# Patient Record
Sex: Male | Born: 1990 | Race: White | Hispanic: No | Marital: Single
Health system: Southern US, Community
[De-identification: ages and names within clinical notes are randomized; demographics above are authoritative.]

---

## 2009-12-15 ENCOUNTER — Emergency Department: Payer: Self-pay | Admitting: Emergency Medicine

## 2009-12-15 ENCOUNTER — Emergency Department: Payer: Self-pay | Admitting: Unknown Physician Specialty

## 2011-04-14 ENCOUNTER — Emergency Department: Payer: Self-pay | Admitting: Emergency Medicine

## 2011-11-21 ENCOUNTER — Emergency Department: Payer: Self-pay | Admitting: Emergency Medicine

## 2011-11-25 ENCOUNTER — Emergency Department: Payer: Self-pay | Admitting: Emergency Medicine

## 2011-11-25 LAB — COMPREHENSIVE METABOLIC PANEL
Albumin: 3.6 g/dL (ref 3.4–5.0)
Alkaline Phosphatase: 124 U/L (ref 50–136)
BUN: 12 mg/dL (ref 7–18)
Calcium, Total: 8.9 mg/dL (ref 8.5–10.1)
Co2: 30 mmol/L (ref 21–32)
EGFR (Non-African Amer.): >60
Glucose: 124 mg/dL — ABNORMAL HIGH (ref 65–99)
Osmolality: 279 (ref 275–301)
SGOT(AST): 21 U/L (ref 15–37)
SGPT (ALT): 20 U/L
Sodium: 139 mmol/L (ref 136–145)

## 2011-11-25 LAB — CBC
HCT: 44.1 % (ref 40.0–52.0)
HGB: 14.7 g/dL (ref 13.0–18.0)
MCH: 31.3 pg (ref 26.0–34.0)
MCHC: 33.3 g/dL (ref 32.0–36.0)
Platelet: 217 10*3/uL (ref 150–440)
RDW: 14 % (ref 11.5–14.5)

## 2011-11-25 LAB — URINALYSIS, COMPLETE
Bilirubin,UR: NEGATIVE
Glucose,UR: NEGATIVE mg/dL (ref 0–75)
Hyaline Cast: 10
Ketone: NEGATIVE
Leukocyte Esterase: NEGATIVE
Protein: NEGATIVE
RBC,UR: 12 /HPF (ref 0–5)
Specific Gravity: 1.024 (ref 1.003–1.030)
Squamous Epithelial: 1
WBC UR: 16 /HPF (ref 0–5)

## 2011-11-25 LAB — DRUG SCREEN, URINE
Amphetamines, Ur Screen: NEGATIVE (ref ?–1000)
Barbiturates, Ur Screen: NEGATIVE (ref ?–200)
Cocaine Metabolite,Ur ~~LOC~~: NEGATIVE (ref ?–300)
MDMA (Ecstasy)Ur Screen: NEGATIVE (ref ?–500)
Opiate, Ur Screen: NEGATIVE (ref ?–300)
Phencyclidine (PCP) Ur S: NEGATIVE (ref ?–25)

## 2011-11-25 LAB — ETHANOL: Ethanol %: <0.003 % (ref 0.000–0.080)

## 2011-11-25 LAB — TSH: Thyroid Stimulating Horm: 0.35 u[IU]/mL — ABNORMAL LOW

## 2012-05-27 ENCOUNTER — Emergency Department: Payer: Self-pay | Admitting: Emergency Medicine

## 2013-01-20 ENCOUNTER — Emergency Department: Payer: Self-pay | Admitting: Emergency Medicine

## 2013-04-27 ENCOUNTER — Emergency Department: Payer: Self-pay | Admitting: Emergency Medicine

## 2014-02-14 IMAGING — CR DG CHEST 1V
1 series · 1 of 1 positions shown · non-contrast
Comparison: none

REASON FOR EXAM: fall 1 week ago..
COMMENTS:

PROCEDURE:     DXR - DXR CHEST 1 VIEWAP OR PA  - November 21, 2011  [DATE]
RESULT:     Lungs clear. Cardiovascular structures are unremarkable.
Minimally displaced right posterior eighth rib fracture. No pneumothorax.

[pa]
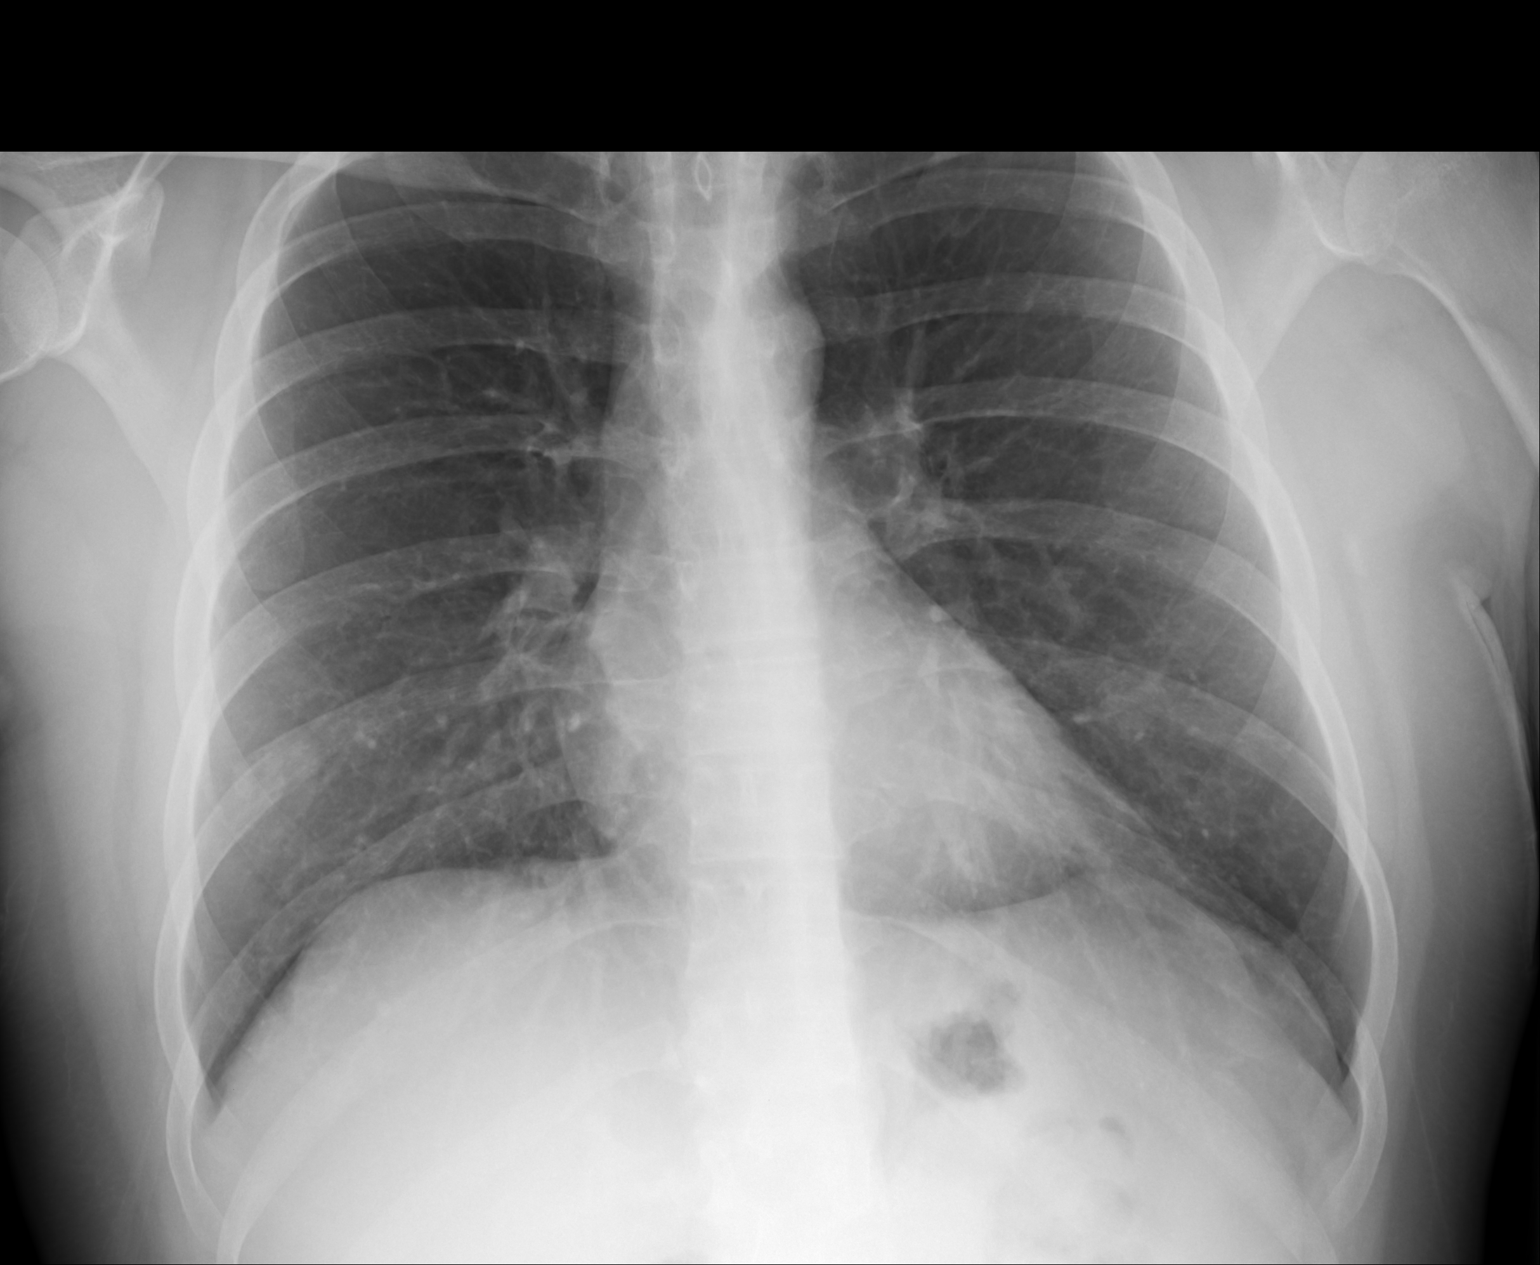

[1 of 1 positions shown; findings below may reference images not displayed]

IMPRESSION: Minimally displaced right eighth rib fracture noted. No
pneumothorax.

## 2014-08-23 ENCOUNTER — Emergency Department
Admit: 2014-08-23 | Disposition: A | Discharge status: Home / Self Care | Payer: Self-pay | Admitting: Emergency Medicine

## 2014-08-27 NOTE — Consult Note (Signed)
Brief Consult Note: Diagnosis: polysubstance dependence.   Patient was seen by consultant.   Consult note dictated.   Discussed with Attending MD.   Comments: Psychiatry: Patient seen and notes reviewed. Patient denies SI and is calm and lucid and with upbeat affect. No neeed for detox. Patient agrees to outpt treatment. No longer commitable. Discontinue commitment paperwork. Discussed with ER MD.  Electronic Signatures: Audery Amellapacs, Dona Walby T (MD)  (Signed 19-Jul-13 12:06)  Authored: Brief Consult Note   Last Updated: 19-Jul-13 12:06 by Audery Amellapacs, Nolan Lasser T (MD)

## 2014-08-27 NOTE — Consult Note (Signed)
PATIENT NAME:  Eric Acosta, Christerpher MR#:  [id number] DATE OF BIRTH:  12-11-1990  DATE OF CONSULTATION:  11/26/2011  REFERRING PHYSICIAN:   CONSULTING PHYSICIAN:  Audery AmelJohn T. Lavan Imes, MD  IDENTIFYING INFORMATION AND REASON FOR CONSULT: 24 year old man brought into the Emergency Room agitated and confused after consuming drugs. Consult for evaluation of dangerousness.   HISTORY OF PRESENT ILLNESS: Information obtained from the patient, from the patient's girlfriend and from the chart. Patient was brought to the Emergency Room last night because his girlfriend found him in a confused, agitated state moving as though he were having seizures, probably delirious. EMS picked him up and brought him into the Emergency Room. The history is that he had consumed some artificial marijuana earlier in the evening. Girlfriend says that he has done it before and when he does he gets confused like this. Since waiting overnight patient is now calm and lucid, no longer delirious. He denies any recent mood symptoms. He denies any suicidal ideation. Denies homicidal ideation. States that he got out of [facility] several weeks ago and has remained sober off of narcotics at least since then. He says that [facility] and says that he has insight into how his substance abuse has been a problem for him. Denies any ongoing psychotic symptoms. I spoke with the girlfriend who stays with him today and she confirmed that she had no reason to think he had suicidal ideation and had not heard him make any suicidal statements or behave in a suicidal manner.   PAST PSYCHIATRIC HISTORY: Patient says he has been abusing drugs for several years. Primarily he abuses prescription narcotics. He says that he was at [facility] recently and has stayed off of narcotics since then. He still smokes marijuana. He claims that it is only every couple of weeks but it may be more frequent than that. Denies that he abuses  alcohol. He has been diagnosed with depression on a couple of occasions in the past, although it is not clear that it was separate from the substance abuse issue. He is not currently taking any antidepressant medicine. Denies any history of suicide attempts.   PAST MEDICAL HISTORY: He says a couple of weeks ago he suffered a fall down a flight of steps and broke a rib. He was given some tramadol and has taken it intermittently for the pain. He denies abusing it and says that it does not get him high and he has not felt like abusing it. No other known ongoing medical problems.   SOCIAL HISTORY: Not working currently. Lives with his girlfriend at his parents house. Planing to move out of there and get a place of their own. 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   CURRENT MEDICATIONS:  1. Neurontin 600 mg 3 times a day for pain.  2. Tramadol p.r.n.   ALLERGIES: No known drug allergies.   REVIEW OF SYSTEMS: Complains of an aching pain on his right side, especially when he inspires deeply. Denies depression. Denies suicidal ideation. Denies hallucinations. Not feeling agitated, not feeling hostile.   MENTAL STATUS EXAM: Slightly disheveled young man in hospital garb interviewed in the Emergency Room. He is cooperative and polite during the interview. Makes good eye contact. Speech is a little bit rapid but controlled. Not loud. Affect is anxious, but appropriate tone. Mood is stated as wanting to get out of the hospital. Denies any depression or  hopelessness. Denies suicidal or homicidal ideation. Thoughts appear lucid with no evidence of thought disorder. No evidence of paranoia. Denies hallucinations. Seems to be of average intelligence. Short-term memory currently intact. Memory impaired for the events of last night but otherwise intact. Alert and oriented x4. Judgment and insight were recently impaired around substance abuse but appear improved.   ASSESSMENT: This is a  24 year old man with a substance abuse problem. When he was brought in last night he was delirious having consumed some artificial marijuana or some other sort of new artificial drug. Currently he has come down off of that and is back to his baseline mental state. He denies any suicidal ideation whatsoever. Girlfriend denies that she has heard him or has any reason to think that he has made any suicidal statements and does not have any concern about his being dangerous to himself. Patient is not currently psychotic. He does have a safe place to live. He is already set up with outpatient psychiatric treatment in the community.   TREATMENT PLAN: Discontinue involuntary commitment as he no longer meets criteria. Patient was counseled about the dangers of drug use and particularly about the dangers of artificial marijuana, spice, bath salts, and other of the new artificial drugs many of which seemed to be prone to making people delirious. Strongly encouraged to go to outpatient mental health treatment as well as Narcotics Anonymous and he agrees to the plan.    DIAGNOSIS PRINCIPLE AND PRIMARY:  AXIS I: Delirium secondary to drug abuse, now resolved.   SECONDARY DIAGNOSES:  AXIS I: Polysubstance dependence.   AXIS II: Deferred.   AXIS III: No diagnosis.   AXIS IV: Moderate. Chronic stress from joblessness and limited resources.   AXIS V: Functioning at time of discharge and evaluation 54.   TOTAL TIME SPENT ON THIS: 60 minutes.   ____________________________ Audery Amel, MD jtc:cms D: 11/26/2011 12:21:57 ET T: 11/26/2011 12:46:17 ET JOB#: [id number]  cc: Audery Amel, MD, <Dictator>  Audery Amel MD ELECTRONICALLY SIGNED 11/26/2011 18:22

## 2015-04-16 IMAGING — CR RIGHT TIBIA AND FIBULA - 2 VIEW
1 series · 2 of 2 positions shown · non-contrast
Comparison: none

REASON FOR EXAM: pain after injury  -  ed waiting room
COMMENTS:   May transport without cardiac monitor

PROCEDURE:     DXR - DXR TIBIA AND FIBULA RT (LOWER L  - January 20, 2013 [DATE]
RESULT:     There is no evidence of fracture, dislocation, or malalignment.

[Series 1: x tib-fib ap right · 0.14mm/px · 2 of 2 slices shown]
[im 1/2]
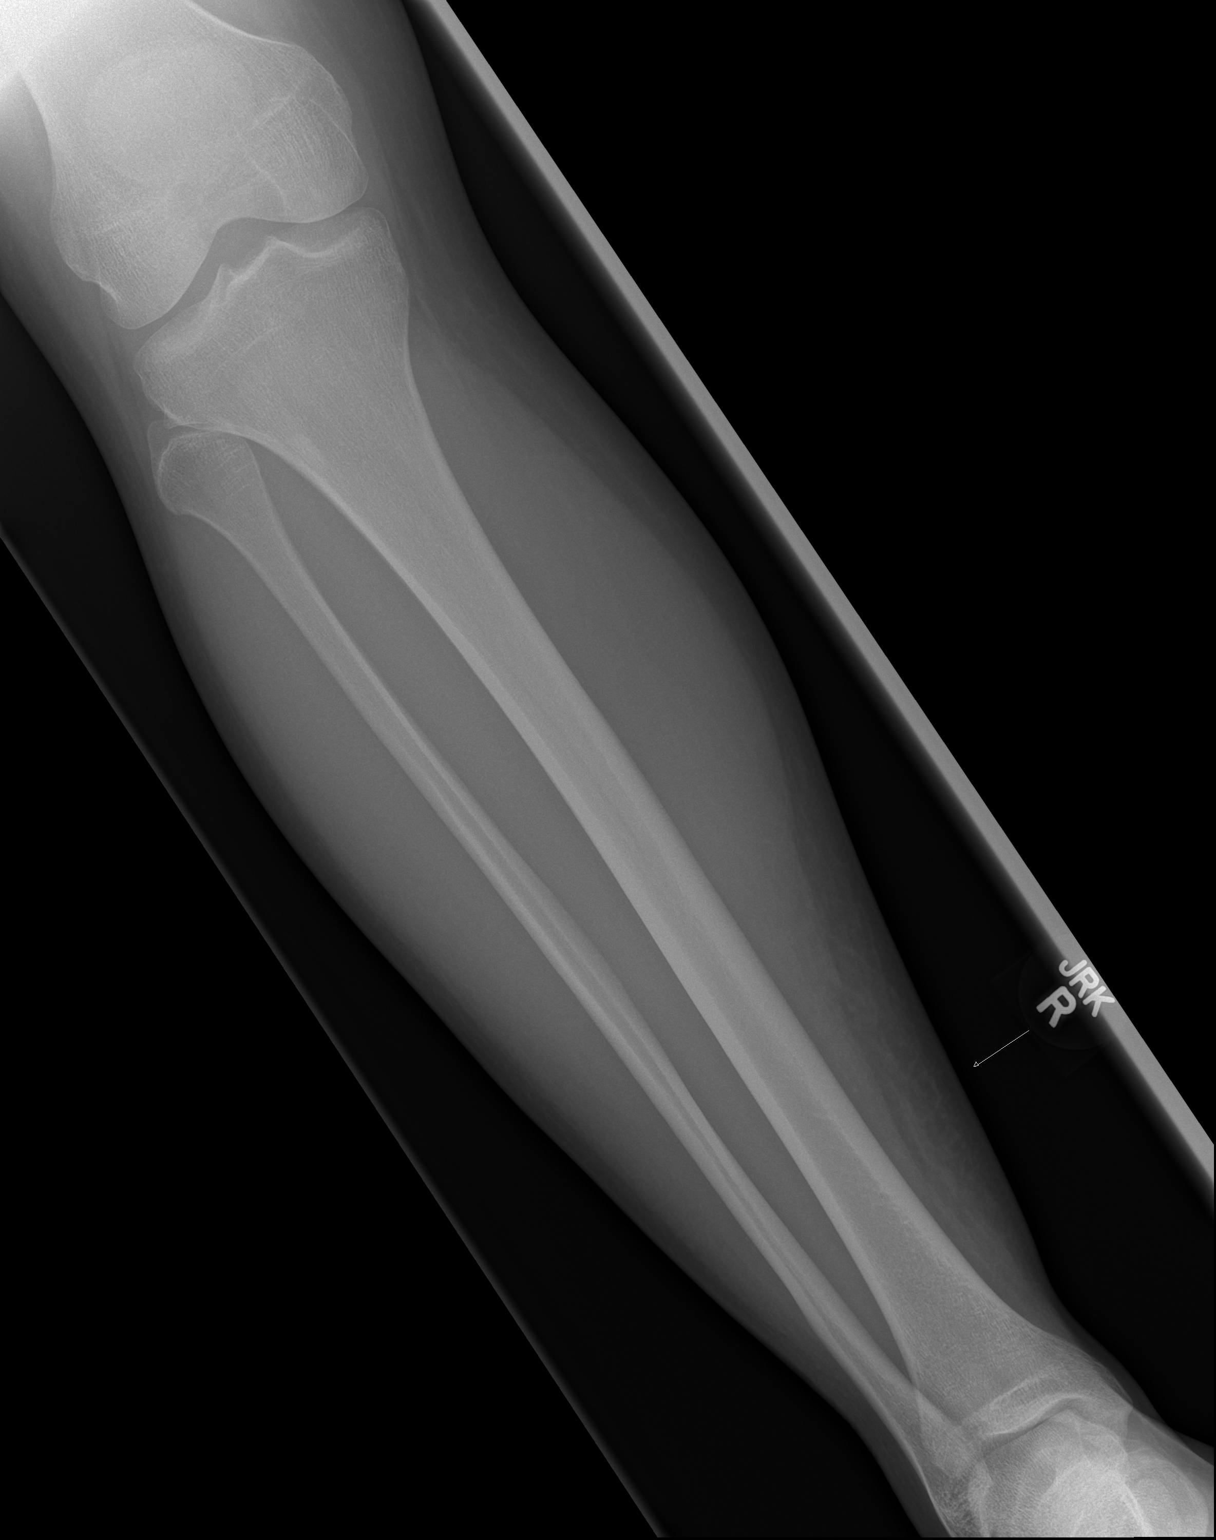
[im 2/2]
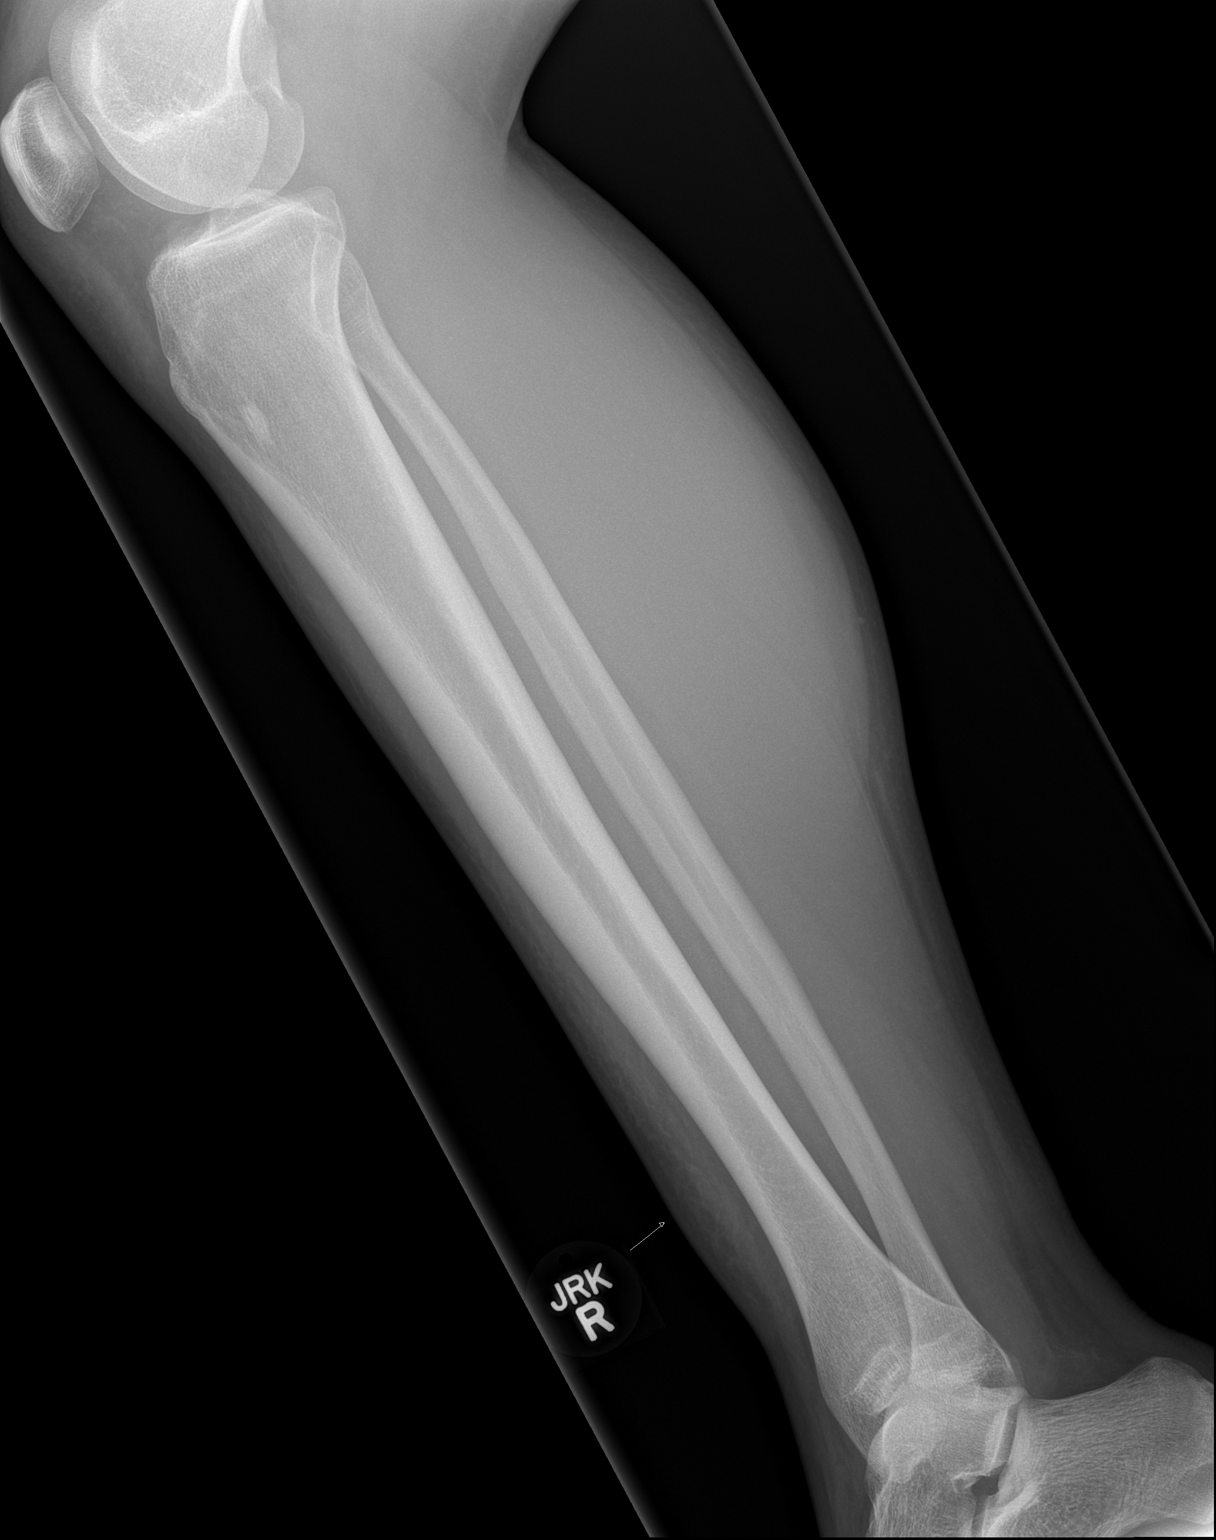

[2 of 2 positions shown; findings below may reference images not displayed]

IMPRESSION: 1. No evidence of acute abnormalities.
2. If there are persistent complaints of pain or persistent clinical
concern, a repeat evaluation in 7-10 days is recommended if clinically
warranted.
